# Patient Record
Sex: Male | Born: 1965 | Race: White | Hispanic: No | Marital: Married
Health system: Southern US, Community
[De-identification: ages and names within clinical notes are randomized; demographics above are authoritative.]

## PROBLEM LIST (undated history)

## (undated) DIAGNOSIS — I10 Essential (primary) hypertension: Secondary | ICD-10-CM

---

## 2019-05-20 DIAGNOSIS — R739 Hyperglycemia, unspecified: Secondary | ICD-10-CM | POA: Diagnosis not present

## 2019-05-20 DIAGNOSIS — R200 Anesthesia of skin: Secondary | ICD-10-CM | POA: Diagnosis not present

## 2019-05-20 DIAGNOSIS — I10 Essential (primary) hypertension: Secondary | ICD-10-CM | POA: Diagnosis not present

## 2019-05-20 DIAGNOSIS — E782 Mixed hyperlipidemia: Secondary | ICD-10-CM | POA: Diagnosis not present

## 2019-05-20 DIAGNOSIS — E0590 Thyrotoxicosis, unspecified without thyrotoxic crisis or storm: Secondary | ICD-10-CM | POA: Diagnosis not present

## 2019-05-20 DIAGNOSIS — R69 Illness, unspecified: Secondary | ICD-10-CM | POA: Diagnosis not present

## 2019-05-20 DIAGNOSIS — M1990 Unspecified osteoarthritis, unspecified site: Secondary | ICD-10-CM | POA: Diagnosis not present

## 2019-05-20 DIAGNOSIS — Z6831 Body mass index (BMI) 31.0-31.9, adult: Secondary | ICD-10-CM | POA: Diagnosis not present

## 2019-07-21 DIAGNOSIS — R002 Palpitations: Secondary | ICD-10-CM | POA: Diagnosis not present

## 2019-07-21 DIAGNOSIS — R69 Illness, unspecified: Secondary | ICD-10-CM | POA: Diagnosis not present

## 2019-07-21 DIAGNOSIS — R000 Tachycardia, unspecified: Secondary | ICD-10-CM | POA: Diagnosis not present

## 2019-07-22 DIAGNOSIS — R000 Tachycardia, unspecified: Secondary | ICD-10-CM | POA: Diagnosis not present

## 2019-08-20 DIAGNOSIS — Z125 Encounter for screening for malignant neoplasm of prostate: Secondary | ICD-10-CM | POA: Diagnosis not present

## 2019-08-20 DIAGNOSIS — R69 Illness, unspecified: Secondary | ICD-10-CM | POA: Diagnosis not present

## 2019-08-20 DIAGNOSIS — E782 Mixed hyperlipidemia: Secondary | ICD-10-CM | POA: Diagnosis not present

## 2019-08-20 DIAGNOSIS — R739 Hyperglycemia, unspecified: Secondary | ICD-10-CM | POA: Diagnosis not present

## 2019-08-20 DIAGNOSIS — Z0000 Encounter for general adult medical examination without abnormal findings: Secondary | ICD-10-CM | POA: Diagnosis not present

## 2019-08-20 DIAGNOSIS — Z6832 Body mass index (BMI) 32.0-32.9, adult: Secondary | ICD-10-CM | POA: Diagnosis not present

## 2019-08-20 DIAGNOSIS — M1990 Unspecified osteoarthritis, unspecified site: Secondary | ICD-10-CM | POA: Diagnosis not present

## 2019-08-20 DIAGNOSIS — I10 Essential (primary) hypertension: Secondary | ICD-10-CM | POA: Diagnosis not present

## 2019-11-26 DIAGNOSIS — E0590 Thyrotoxicosis, unspecified without thyrotoxic crisis or storm: Secondary | ICD-10-CM | POA: Diagnosis not present

## 2019-11-26 DIAGNOSIS — Z6833 Body mass index (BMI) 33.0-33.9, adult: Secondary | ICD-10-CM | POA: Diagnosis not present

## 2019-11-26 DIAGNOSIS — R69 Illness, unspecified: Secondary | ICD-10-CM | POA: Diagnosis not present

## 2019-11-26 DIAGNOSIS — E782 Mixed hyperlipidemia: Secondary | ICD-10-CM | POA: Diagnosis not present

## 2019-11-26 DIAGNOSIS — R739 Hyperglycemia, unspecified: Secondary | ICD-10-CM | POA: Diagnosis not present

## 2019-11-26 DIAGNOSIS — I10 Essential (primary) hypertension: Secondary | ICD-10-CM | POA: Diagnosis not present

## 2019-11-26 DIAGNOSIS — M1990 Unspecified osteoarthritis, unspecified site: Secondary | ICD-10-CM | POA: Diagnosis not present

## 2020-02-27 DIAGNOSIS — R739 Hyperglycemia, unspecified: Secondary | ICD-10-CM | POA: Diagnosis not present

## 2020-02-27 DIAGNOSIS — E0590 Thyrotoxicosis, unspecified without thyrotoxic crisis or storm: Secondary | ICD-10-CM | POA: Diagnosis not present

## 2020-02-27 DIAGNOSIS — E782 Mixed hyperlipidemia: Secondary | ICD-10-CM | POA: Diagnosis not present

## 2020-02-27 DIAGNOSIS — R748 Abnormal levels of other serum enzymes: Secondary | ICD-10-CM | POA: Diagnosis not present

## 2020-07-27 DIAGNOSIS — M47817 Spondylosis without myelopathy or radiculopathy, lumbosacral region: Secondary | ICD-10-CM | POA: Diagnosis not present

## 2020-07-27 DIAGNOSIS — S32001A Stable burst fracture of unspecified lumbar vertebra, initial encounter for closed fracture: Secondary | ICD-10-CM | POA: Diagnosis not present

## 2020-07-27 DIAGNOSIS — Z20822 Contact with and (suspected) exposure to covid-19: Secondary | ICD-10-CM | POA: Diagnosis not present

## 2020-07-27 DIAGNOSIS — S32012A Unstable burst fracture of first lumbar vertebra, initial encounter for closed fracture: Secondary | ICD-10-CM | POA: Diagnosis not present

## 2020-07-27 DIAGNOSIS — M545 Low back pain: Secondary | ICD-10-CM | POA: Diagnosis not present

## 2020-07-27 DIAGNOSIS — R222 Localized swelling, mass and lump, trunk: Secondary | ICD-10-CM | POA: Diagnosis not present

## 2020-07-27 DIAGNOSIS — M5127 Other intervertebral disc displacement, lumbosacral region: Secondary | ICD-10-CM | POA: Diagnosis not present

## 2020-07-27 DIAGNOSIS — R0902 Hypoxemia: Secondary | ICD-10-CM | POA: Diagnosis not present

## 2020-07-27 DIAGNOSIS — Y92410 Unspecified street and highway as the place of occurrence of the external cause: Secondary | ICD-10-CM | POA: Diagnosis not present

## 2020-07-27 DIAGNOSIS — Y999 Unspecified external cause status: Secondary | ICD-10-CM | POA: Diagnosis not present

## 2020-07-27 DIAGNOSIS — S32011A Stable burst fracture of first lumbar vertebra, initial encounter for closed fracture: Secondary | ICD-10-CM | POA: Diagnosis not present

## 2020-07-27 DIAGNOSIS — R52 Pain, unspecified: Secondary | ICD-10-CM | POA: Diagnosis not present

## 2020-07-28 DIAGNOSIS — Z7401 Bed confinement status: Secondary | ICD-10-CM | POA: Diagnosis not present

## 2020-07-28 DIAGNOSIS — Y999 Unspecified external cause status: Secondary | ICD-10-CM | POA: Diagnosis not present

## 2020-07-28 DIAGNOSIS — M545 Low back pain: Secondary | ICD-10-CM | POA: Diagnosis not present

## 2020-07-28 DIAGNOSIS — R0902 Hypoxemia: Secondary | ICD-10-CM | POA: Diagnosis not present

## 2020-07-28 DIAGNOSIS — Z20822 Contact with and (suspected) exposure to covid-19: Secondary | ICD-10-CM | POA: Diagnosis not present

## 2020-07-28 DIAGNOSIS — S32001A Stable burst fracture of unspecified lumbar vertebra, initial encounter for closed fracture: Secondary | ICD-10-CM | POA: Diagnosis not present

## 2020-07-28 DIAGNOSIS — Y92410 Unspecified street and highway as the place of occurrence of the external cause: Secondary | ICD-10-CM | POA: Diagnosis not present

## 2020-07-28 DIAGNOSIS — R52 Pain, unspecified: Secondary | ICD-10-CM | POA: Diagnosis not present

## 2020-07-28 DIAGNOSIS — M2550 Pain in unspecified joint: Secondary | ICD-10-CM | POA: Diagnosis not present

## 2020-07-30 ENCOUNTER — Emergency Department (HOSPITAL_COMMUNITY): Payer: No Typology Code available for payment source

## 2020-07-30 ENCOUNTER — Encounter (HOSPITAL_COMMUNITY): Payer: Self-pay | Admitting: *Deleted

## 2020-07-30 ENCOUNTER — Other Ambulatory Visit: Payer: Self-pay

## 2020-07-30 ENCOUNTER — Inpatient Hospital Stay (HOSPITAL_COMMUNITY)
Admission: EM | Admit: 2020-07-30 | Discharge: 2020-08-01 | DRG: 917 | Disposition: A | Discharge status: Home / Self Care | Payer: No Typology Code available for payment source | Attending: Internal Medicine | Admitting: Internal Medicine

## 2020-07-30 DIAGNOSIS — G92 Toxic encephalopathy: Secondary | ICD-10-CM | POA: Diagnosis not present

## 2020-07-30 DIAGNOSIS — E872 Acidosis, unspecified: Secondary | ICD-10-CM

## 2020-07-30 DIAGNOSIS — R4020 Unspecified coma: Secondary | ICD-10-CM | POA: Diagnosis not present

## 2020-07-30 DIAGNOSIS — J690 Pneumonitis due to inhalation of food and vomit: Secondary | ICD-10-CM

## 2020-07-30 DIAGNOSIS — I959 Hypotension, unspecified: Secondary | ICD-10-CM | POA: Diagnosis present

## 2020-07-30 DIAGNOSIS — Z79899 Other long term (current) drug therapy: Secondary | ICD-10-CM | POA: Diagnosis not present

## 2020-07-30 DIAGNOSIS — S32011D Stable burst fracture of first lumbar vertebra, subsequent encounter for fracture with routine healing: Secondary | ICD-10-CM

## 2020-07-30 DIAGNOSIS — Z8249 Family history of ischemic heart disease and other diseases of the circulatory system: Secondary | ICD-10-CM | POA: Diagnosis not present

## 2020-07-30 DIAGNOSIS — E785 Hyperlipidemia, unspecified: Secondary | ICD-10-CM | POA: Diagnosis present

## 2020-07-30 DIAGNOSIS — R0602 Shortness of breath: Secondary | ICD-10-CM

## 2020-07-30 DIAGNOSIS — N179 Acute kidney failure, unspecified: Secondary | ICD-10-CM | POA: Diagnosis present

## 2020-07-30 DIAGNOSIS — I10 Essential (primary) hypertension: Secondary | ICD-10-CM | POA: Diagnosis present

## 2020-07-30 DIAGNOSIS — F329 Major depressive disorder, single episode, unspecified: Secondary | ICD-10-CM | POA: Diagnosis present

## 2020-07-30 DIAGNOSIS — E875 Hyperkalemia: Secondary | ICD-10-CM | POA: Diagnosis present

## 2020-07-30 DIAGNOSIS — R0600 Dyspnea, unspecified: Secondary | ICD-10-CM | POA: Diagnosis not present

## 2020-07-30 DIAGNOSIS — F419 Anxiety disorder, unspecified: Secondary | ICD-10-CM | POA: Diagnosis present

## 2020-07-30 DIAGNOSIS — R69 Illness, unspecified: Secondary | ICD-10-CM | POA: Diagnosis not present

## 2020-07-30 DIAGNOSIS — J9602 Acute respiratory failure with hypercapnia: Secondary | ICD-10-CM | POA: Diagnosis present

## 2020-07-30 DIAGNOSIS — D72829 Elevated white blood cell count, unspecified: Secondary | ICD-10-CM | POA: Diagnosis present

## 2020-07-30 DIAGNOSIS — R0902 Hypoxemia: Secondary | ICD-10-CM | POA: Diagnosis not present

## 2020-07-30 DIAGNOSIS — E1165 Type 2 diabetes mellitus with hyperglycemia: Secondary | ICD-10-CM | POA: Diagnosis not present

## 2020-07-30 DIAGNOSIS — T402X1A Poisoning by other opioids, accidental (unintentional), initial encounter: Secondary | ICD-10-CM | POA: Diagnosis present

## 2020-07-30 DIAGNOSIS — G9340 Encephalopathy, unspecified: Secondary | ICD-10-CM

## 2020-07-30 DIAGNOSIS — Z20822 Contact with and (suspected) exposure to covid-19: Secondary | ICD-10-CM | POA: Diagnosis present

## 2020-07-30 DIAGNOSIS — J90 Pleural effusion, not elsewhere classified: Secondary | ICD-10-CM | POA: Diagnosis not present

## 2020-07-30 DIAGNOSIS — G4733 Obstructive sleep apnea (adult) (pediatric): Secondary | ICD-10-CM | POA: Diagnosis present

## 2020-07-30 DIAGNOSIS — Y92009 Unspecified place in unspecified non-institutional (private) residence as the place of occurrence of the external cause: Secondary | ICD-10-CM

## 2020-07-30 DIAGNOSIS — J9601 Acute respiratory failure with hypoxia: Secondary | ICD-10-CM | POA: Diagnosis not present

## 2020-07-30 DIAGNOSIS — R404 Transient alteration of awareness: Secondary | ICD-10-CM | POA: Diagnosis not present

## 2020-07-30 DIAGNOSIS — R0689 Other abnormalities of breathing: Secondary | ICD-10-CM | POA: Diagnosis not present

## 2020-07-30 HISTORY — DX: Essential (primary) hypertension: I10

## 2020-07-30 LAB — BASIC METABOLIC PANEL
Anion gap: 17 — ABNORMAL HIGH (ref 5–15)
BUN: 36 mg/dL — ABNORMAL HIGH (ref 6–20)
CO2: 20 mmol/L — ABNORMAL LOW (ref 22–32)
Calcium: 7.8 mg/dL — ABNORMAL LOW (ref 8.9–10.3)
Chloride: 104 mmol/L (ref 98–111)
Creatinine, Ser: 5.37 mg/dL — ABNORMAL HIGH (ref 0.61–1.24)
GFR calc Af Amer: 13 mL/min — ABNORMAL LOW (ref >60)
GFR calc non Af Amer: 11 mL/min — ABNORMAL LOW (ref >60)
Glucose, Bld: 171 mg/dL — ABNORMAL HIGH (ref 70–99)
Potassium: 5.2 mmol/L — ABNORMAL HIGH (ref 3.5–5.1)
Sodium: 141 mmol/L (ref 135–145)

## 2020-07-30 LAB — I-STAT ARTERIAL BLOOD GAS, ED
Acid-base deficit: 2 mmol/L (ref 0.0–2.0)
Bicarbonate: 27.2 mmol/L (ref 20.0–28.0)
Calcium, Ion: 1.14 mmol/L — ABNORMAL LOW (ref 1.15–1.40)
HCT: 38 % — ABNORMAL LOW (ref 39.0–52.0)
Hemoglobin: 12.9 g/dL — ABNORMAL LOW (ref 13.0–17.0)
O2 Saturation: 82 %
Potassium: 4.5 mmol/L (ref 3.5–5.1)
Sodium: 141 mmol/L (ref 135–145)
TCO2: 29 mmol/L (ref 22–32)
pCO2 arterial: 69.1 mmHg (ref 32.0–48.0)
pH, Arterial: 7.203 — ABNORMAL LOW (ref 7.350–7.450)
pO2, Arterial: 58 mmHg — ABNORMAL LOW (ref 83.0–108.0)

## 2020-07-30 LAB — COMPREHENSIVE METABOLIC PANEL
ALT: 49 U/L — ABNORMAL HIGH (ref 0–44)
AST: 44 U/L — ABNORMAL HIGH (ref 15–41)
Albumin: 3.5 g/dL (ref 3.5–5.0)
Alkaline Phosphatase: 51 U/L (ref 38–126)
Anion gap: 12 (ref 5–15)
BUN: 36 mg/dL — ABNORMAL HIGH (ref 6–20)
CO2: 24 mmol/L (ref 22–32)
Calcium: 8 mg/dL — ABNORMAL LOW (ref 8.9–10.3)
Chloride: 105 mmol/L (ref 98–111)
Creatinine, Ser: 4.75 mg/dL — ABNORMAL HIGH (ref 0.61–1.24)
GFR calc Af Amer: 15 mL/min — ABNORMAL LOW (ref >60)
GFR calc non Af Amer: 13 mL/min — ABNORMAL LOW (ref >60)
Glucose, Bld: 102 mg/dL — ABNORMAL HIGH (ref 70–99)
Potassium: 4.4 mmol/L (ref 3.5–5.1)
Sodium: 141 mmol/L (ref 135–145)
Total Bilirubin: 1.4 mg/dL — ABNORMAL HIGH (ref 0.3–1.2)
Total Protein: 6.2 g/dL — ABNORMAL LOW (ref 6.5–8.1)

## 2020-07-30 LAB — CBC
HCT: 40.6 % (ref 39.0–52.0)
Hemoglobin: 13.1 g/dL (ref 13.0–17.0)
MCH: 30.5 pg (ref 26.0–34.0)
MCHC: 32.3 g/dL (ref 30.0–36.0)
MCV: 94.6 fL (ref 80.0–100.0)
Platelets: 215 10*3/uL (ref 150–400)
RBC: 4.29 MIL/uL (ref 4.22–5.81)
RDW: 13 % (ref 11.5–15.5)
WBC: 12.3 10*3/uL — ABNORMAL HIGH (ref 4.0–10.5)
nRBC: 0 % (ref 0.0–0.2)

## 2020-07-30 LAB — BLOOD GAS, ARTERIAL
Acid-base deficit: 1.5 mmol/L (ref 0.0–2.0)
Bicarbonate: 25.1 mmol/L (ref 20.0–28.0)
Drawn by: 59156
FIO2: 40
O2 Saturation: 95.5 %
Patient temperature: 37.2
pCO2 arterial: 63.5 mmHg — ABNORMAL HIGH (ref 32.0–48.0)
pH, Arterial: 7.223 — ABNORMAL LOW (ref 7.350–7.450)
pO2, Arterial: 85.2 mmHg (ref 83.0–108.0)

## 2020-07-30 LAB — CBG MONITORING, ED: Glucose-Capillary: 164 mg/dL — ABNORMAL HIGH (ref 70–99)

## 2020-07-30 LAB — LACTIC ACID, PLASMA
Lactic Acid, Venous: 3.3 mmol/L (ref 0.5–1.9)
Lactic Acid, Venous: 1.5 mmol/L (ref 0.5–1.9)

## 2020-07-30 LAB — SARS CORONAVIRUS 2 BY RT PCR (HOSPITAL ORDER, PERFORMED IN ~~LOC~~ HOSPITAL LAB): SARS Coronavirus 2: NEGATIVE

## 2020-07-30 LAB — HIV ANTIBODY (ROUTINE TESTING W REFLEX): HIV Screen 4th Generation wRfx: NONREACTIVE

## 2020-07-30 LAB — PROCALCITONIN: Procalcitonin: 0.41 ng/mL

## 2020-07-30 LAB — CK: Total CK: 401 U/L — ABNORMAL HIGH (ref 49–397)

## 2020-07-30 LAB — ETHANOL: Alcohol, Ethyl (B): <10 mg/dL (ref <10)

## 2020-07-30 MED ORDER — LACTATED RINGERS IV BOLUS
1000.0000 mL | Freq: Once | INTRAVENOUS | Status: AC
  Administered 2020-07-30: 1000 mL via INTRAVENOUS

## 2020-07-30 MED ORDER — NALOXONE HCL 0.4 MG/ML IJ SOLN: 0.4000 mg | INTRAMUSCULAR | Status: DC | PRN

## 2020-07-30 MED ORDER — HEPARIN SODIUM (PORCINE) 5000 UNIT/ML IJ SOLN
5000.0000 [IU] | Freq: Three times a day (TID) | INTRAMUSCULAR | Status: DC
  Administered 2020-07-30 – 2020-08-01 (×6): 5000 [IU] via SUBCUTANEOUS
  Filled 2020-07-30 (×6): qty 1

## 2020-07-30 MED ORDER — LIDOCAINE 5 % EX PTCH
1.0000 | MEDICATED_PATCH | CUTANEOUS | Status: DC
  Administered 2020-07-30 – 2020-07-31 (×2): 1 via TRANSDERMAL
  Filled 2020-07-30 (×3): qty 1

## 2020-07-30 MED ORDER — SODIUM CHLORIDE 0.9 % IV SOLN: INTRAVENOUS | Status: DC

## 2020-07-30 MED ORDER — LACTATED RINGERS IV SOLN: INTRAVENOUS | Status: DC

## 2020-07-30 MED ORDER — POLYETHYLENE GLYCOL 3350 17 G PO PACK: 17.0000 g | PACK | Freq: Every day | ORAL | Status: DC | PRN

## 2020-07-30 MED ORDER — NALOXONE HCL 2 MG/2ML IJ SOSY
1.0000 mg | PREFILLED_SYRINGE | Freq: Once | INTRAMUSCULAR | Status: AC
  Administered 2020-07-30: 1 mg via INTRAVENOUS
  Filled 2020-07-30: qty 2

## 2020-07-30 MED ORDER — LACTATED RINGERS IV BOLUS
500.0000 mL | Freq: Once | INTRAVENOUS | Status: AC
  Administered 2020-07-30: 500 mL via INTRAVENOUS

## 2020-07-30 MED ORDER — PIPERACILLIN-TAZOBACTAM 3.375 G IVPB 30 MIN
3.3750 g | Freq: Once | INTRAVENOUS | Status: AC
  Administered 2020-07-30: 3.375 g via INTRAVENOUS
  Filled 2020-07-30: qty 50

## 2020-07-30 MED ORDER — SODIUM ZIRCONIUM CYCLOSILICATE 10 G PO PACK
10.0000 g | PACK | Freq: Once | ORAL | Status: AC
  Administered 2020-07-30: 10 g via ORAL
  Filled 2020-07-30: qty 1

## 2020-07-30 NOTE — Progress Notes (Signed)
RT attempted ABG X 2 without success.  Patient being transported to 2W.  Patient alert and oriented on BiPAP.  Sat's 94% on 40% FiO2.  RT will continue to monitor.
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
## 2020-07-30 NOTE — ED Provider Notes (Signed)
I saw and evaluated the patient, reviewed the resident's note and I agree with the findings and plan.  EKG: EKG Interpretation  Date/Time:  Thursday July 30 2020 10:11:25 EDT Ventricular Rate:  111 PR Interval:    QRS Duration: 105 QT Interval:  345 QTC Calculation: 469 R Axis:   86 Text Interpretation: Sinus tachycardia RSR' in V1 or V2, right VCD or RVH Borderline T abnormalities, inferior leads No old tracing to compare Confirmed by Lorre Nick ([id number]) on 07/30/2020 10:68:60 AM   54 year old male presents here after intentional overdose which responded to Narcan.  Patient is on morphine for back pain.  Is now awake and alert but he is short of breath and coughing.  Chest x-ray concerning for aspiration pneumonia.  Given that he has new oxygen requirement will require admission for aspiration pneumonia   Lorre Nick, MD 07/30/20 1044
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
## 2020-07-31 ENCOUNTER — Inpatient Hospital Stay (HOSPITAL_COMMUNITY): Payer: No Typology Code available for payment source

## 2020-07-31 DIAGNOSIS — J9601 Acute respiratory failure with hypoxia: Secondary | ICD-10-CM

## 2020-07-31 DIAGNOSIS — J9602 Acute respiratory failure with hypercapnia: Secondary | ICD-10-CM

## 2020-07-31 DIAGNOSIS — F329 Major depressive disorder, single episode, unspecified: Secondary | ICD-10-CM

## 2020-07-31 DIAGNOSIS — G92 Toxic encephalopathy: Secondary | ICD-10-CM

## 2020-07-31 DIAGNOSIS — F419 Anxiety disorder, unspecified: Secondary | ICD-10-CM

## 2020-07-31 DIAGNOSIS — N179 Acute kidney failure, unspecified: Secondary | ICD-10-CM

## 2020-07-31 DIAGNOSIS — T402X1A Poisoning by other opioids, accidental (unintentional), initial encounter: Principal | ICD-10-CM

## 2020-07-31 LAB — COMPREHENSIVE METABOLIC PANEL
ALT: 45 U/L — ABNORMAL HIGH (ref 0–44)
AST: 37 U/L (ref 15–41)
Albumin: 3.6 g/dL (ref 3.5–5.0)
Alkaline Phosphatase: 47 U/L (ref 38–126)
Anion gap: 11 (ref 5–15)
BUN: 36 mg/dL — ABNORMAL HIGH (ref 6–20)
CO2: 26 mmol/L (ref 22–32)
Calcium: 8.5 mg/dL — ABNORMAL LOW (ref 8.9–10.3)
Chloride: 104 mmol/L (ref 98–111)
Creatinine, Ser: 2.15 mg/dL — ABNORMAL HIGH (ref 0.61–1.24)
GFR calc Af Amer: 39 mL/min — ABNORMAL LOW (ref >60)
GFR calc non Af Amer: 33 mL/min — ABNORMAL LOW (ref >60)
Glucose, Bld: 109 mg/dL — ABNORMAL HIGH (ref 70–99)
Potassium: 4.2 mmol/L (ref 3.5–5.1)
Sodium: 141 mmol/L (ref 135–145)
Total Bilirubin: 1.4 mg/dL — ABNORMAL HIGH (ref 0.3–1.2)
Total Protein: 6.4 g/dL — ABNORMAL LOW (ref 6.5–8.1)

## 2020-07-31 LAB — BASIC METABOLIC PANEL
Anion gap: 8 (ref 5–15)
BUN: 27 mg/dL — ABNORMAL HIGH (ref 6–20)
CO2: 28 mmol/L (ref 22–32)
Calcium: 8.6 mg/dL — ABNORMAL LOW (ref 8.9–10.3)
Chloride: 102 mmol/L (ref 98–111)
Creatinine, Ser: 1.27 mg/dL — ABNORMAL HIGH (ref 0.61–1.24)
GFR calc Af Amer: >60 mL/min (ref >60)
GFR calc non Af Amer: >60 mL/min (ref >60)
Glucose, Bld: 131 mg/dL — ABNORMAL HIGH (ref 70–99)
Potassium: 4 mmol/L (ref 3.5–5.1)
Sodium: 138 mmol/L (ref 135–145)

## 2020-07-31 LAB — BLOOD GAS, VENOUS
Acid-base deficit: 0 mmol/L (ref 0.0–2.0)
Bicarbonate: 26.1 mmol/L (ref 20.0–28.0)
Drawn by: 8120
FIO2: 40
O2 Saturation: 98.5 %
Patient temperature: 37.3
pCO2, Ven: 59.6 mmHg (ref 44.0–60.0)
pH, Ven: 7.266 (ref 7.250–7.430)
pO2, Ven: 130 mmHg — ABNORMAL HIGH (ref 32.0–45.0)

## 2020-07-31 LAB — BLOOD GAS, ARTERIAL
Acid-Base Excess: 5.3 mmol/L — ABNORMAL HIGH (ref 0.0–2.0)
Acid-base deficit: 0.3 mmol/L (ref 0.0–2.0)
Bicarbonate: 25.7 mmol/L (ref 20.0–28.0)
Bicarbonate: 30.9 mmol/L — ABNORMAL HIGH (ref 20.0–28.0)
FIO2: 40
FIO2: 40
O2 Saturation: 98.9 %
O2 Saturation: 95.3 %
Patient temperature: 37.4
Patient temperature: 37.6
pCO2 arterial: 58.7 mmHg — ABNORMAL HIGH (ref 32.0–48.0)
pCO2 arterial: 62.1 mmHg — ABNORMAL HIGH (ref 32.0–48.0)
pH, Arterial: 7.267 — ABNORMAL LOW (ref 7.350–7.450)
pH, Arterial: 7.322 — ABNORMAL LOW (ref 7.350–7.450)
pO2, Arterial: 142 mmHg — ABNORMAL HIGH (ref 83.0–108.0)
pO2, Arterial: 76.2 mmHg — ABNORMAL LOW (ref 83.0–108.0)

## 2020-07-31 LAB — CBC
HCT: 39.3 % (ref 39.0–52.0)
Hemoglobin: 13.3 g/dL (ref 13.0–17.0)
MCH: 30.9 pg (ref 26.0–34.0)
MCHC: 33.8 g/dL (ref 30.0–36.0)
MCV: 91.2 fL (ref 80.0–100.0)
Platelets: 196 10*3/uL (ref 150–400)
RBC: 4.31 MIL/uL (ref 4.22–5.81)
RDW: 12.7 % (ref 11.5–15.5)
WBC: 10.7 10*3/uL — ABNORMAL HIGH (ref 4.0–10.5)
nRBC: 0 % (ref 0.0–0.2)

## 2020-07-31 LAB — CK: Total CK: 491 U/L — ABNORMAL HIGH (ref 49–397)

## 2020-07-31 MED ORDER — PAROXETINE HCL 20 MG PO TABS
40.0000 mg | ORAL_TABLET | Freq: Every day | ORAL | Status: DC
  Administered 2020-07-31 – 2020-08-01 (×2): 40 mg via ORAL
  Filled 2020-07-31 (×2): qty 2

## 2020-07-31 NOTE — Progress Notes (Signed)
RT NOTES: Removed patient from bipap and placed on 5lpm nasal cannula. Sats 93%. Will continue to monitor.

## 2020-07-31 NOTE — Evaluation (Signed)
SLP Cancellation Note  Patient Details Name: Tom Sutton MRN: [id number] DOB: Nov 06, 1966   Cancelled treatment:       Reason Eval/Treat Not Completed: Other (comment);Medical issues which prohibited therapy (pt on bipap, rn advises she doesn't know when swallow eval will be indicated, advised her to reach out to slp when ready, thanks)   Chales Abrahams 07/31/2020, 7:58 AM   Rolena Infante, MS [facility] SLP Acute Rehab Services Office [phone]

## 2020-07-31 NOTE — Progress Notes (Signed)
SATURATION QUALIFICATIONS: (This note is used to comply with regulatory documentation for home oxygen)  Patient Saturations on Room Air at Rest =85%  Patient Saturations on Room Air while Ambulating = 78%  Patient Saturations on 2 Liters of oxygen while Ambulating = 85%  Patient Saturations on 6 Liters of oxygen while Ambulating = Beginning 97%           End of walk 90%  Please briefly explain why patient needs home oxygen: pt's o2 drops with any exertion.
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
## 2020-07-31 NOTE — Progress Notes (Signed)
RT NOTES: ABG obtained and sent to lab. Lab tech notified.  
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
## 2020-08-01 LAB — BASIC METABOLIC PANEL
Anion gap: 12 (ref 5–15)
BUN: 17 mg/dL (ref 6–20)
CO2: 28 mmol/L (ref 22–32)
Calcium: 9.2 mg/dL (ref 8.9–10.3)
Chloride: 101 mmol/L (ref 98–111)
Creatinine, Ser: 0.72 mg/dL (ref 0.61–1.24)
GFR calc Af Amer: >60 mL/min (ref >60)
GFR calc non Af Amer: >60 mL/min (ref >60)
Glucose, Bld: 116 mg/dL — ABNORMAL HIGH (ref 70–99)
Potassium: 4 mmol/L (ref 3.5–5.1)
Sodium: 141 mmol/L (ref 135–145)

## 2020-08-01 LAB — CBC WITH DIFFERENTIAL/PLATELET
Abs Immature Granulocytes: 0.02 10*3/uL (ref 0.00–0.07)
Basophils Absolute: 0 10*3/uL (ref 0.0–0.1)
Basophils Relative: 0 %
Eosinophils Absolute: 0 10*3/uL (ref 0.0–0.5)
Eosinophils Relative: 1 %
HCT: 37.4 % — ABNORMAL LOW (ref 39.0–52.0)
Hemoglobin: 12.7 g/dL — ABNORMAL LOW (ref 13.0–17.0)
Immature Granulocytes: 0 %
Lymphocytes Relative: 14 %
Lymphs Abs: 1 10*3/uL (ref 0.7–4.0)
MCH: 29.8 pg (ref 26.0–34.0)
MCHC: 34 g/dL (ref 30.0–36.0)
MCV: 87.8 fL (ref 80.0–100.0)
Monocytes Absolute: 0.8 10*3/uL (ref 0.1–1.0)
Monocytes Relative: 11 %
Neutro Abs: 5.4 10*3/uL (ref 1.7–7.7)
Neutrophils Relative %: 74 %
Platelets: 202 10*3/uL (ref 150–400)
RBC: 4.26 MIL/uL (ref 4.22–5.81)
RDW: 12 % (ref 11.5–15.5)
WBC: 7.4 10*3/uL (ref 4.0–10.5)
nRBC: 0 % (ref 0.0–0.2)

## 2020-08-01 LAB — PROCALCITONIN: Procalcitonin: 0.96 ng/mL

## 2020-08-01 MED ORDER — OXYCODONE HCL 5 MG PO TABS: 2.5000 mg | ORAL_TABLET | Freq: Four times a day (QID) | ORAL | 0 refills | Status: AC | PRN

## 2020-08-01 MED ORDER — ACETAMINOPHEN 500 MG PO TABS: 1000.0000 mg | ORAL_TABLET | Freq: Three times a day (TID) | ORAL | 0 refills | Status: AC

## 2020-08-01 MED ORDER — LIDOCAINE 5 % EX PTCH: 1.0000 | MEDICATED_PATCH | CUTANEOUS | 0 refills | Status: AC
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
## 2020-08-01 NOTE — Progress Notes (Signed)
Patient expressing desire to go home.  Tolerated ambulation on R/A well.  MD will be notified.

## 2020-08-01 NOTE — Progress Notes (Signed)
SATURATION QUALIFICATIONS: (This note is used to comply with regulatory documentation for home oxygen)  Patient Saturations on Room Air at Rest = 91% Patient Saturations on Room Air while Ambulating = 97%  Please briefly explain why patient needs home oxygen: Results not indicative of need for home oxygen.

## 2020-08-01 NOTE — Progress Notes (Signed)
   Subjective:   No acute events overnight. Mr. Dery was seen and evaluated at bedside on morning rounds. As soon as we walked in, he expressed the desire to go home. He denies any difficulty breathing, cough, fever, chills, pleuritic CP, nausea or vomiting.   His PCP is at [facility]. Will make an appointment to see her next week. Discussed that we will be changing his pain medications at discharge, and if he maintains good O2 sats with ambulation he will be able to go home today.   Objective:  Vital signs in last 24 hours: Vitals:   08/01/20 0349 08/01/20 0722 08/01/20 0830 08/01/20 0847  BP: (!) 149/96 (!) 172/120    Pulse: 85 66  (!) 102  Resp: 18 19    Temp: 98.5 F (36.9 C) 98.7 F (37.1 C)    TempSrc: Oral     SpO2: 92% 97% 91% 97%   Constitutional: awake, alert, sitting up in bed in NAD Cardiovascular: RRR; no m/r/g Pulmonary: Normal work of breathing, saturating well on room air; CTA throughout   Assessment/Plan:  Principal Problem:   Acute respiratory failure with hypoxia and hypercarbia (HCC) Active Problems:   Acute kidney injury (HCC)   Hyperkalemia   Acute encephalopathy   Metabolic acidosis   Mr. Leisinger is a 54 year old gentleman with medical history significant for hypertension, hyperlipidemia, depression, anxiety who presented to the emergency department with altered mental status due to oversedation from recently prescribed pain medication to treat L1 fracture.This is hospital day 2.   Acute toxic encephalopathy 2/2 oversedation from prescribed opioids Acute hypoxic, hypercarbic respiratory failure 2/2 narcotic use  Possible underlying OHS/OSA Patient's respiratory failure has completely resolved. He is saturating well on room air at rest and with ambulation, indicating this was all related to oversedation from prescribed narcotics.  Will adjust pain medications accordingly. Given his history of snoring, we are recommending outpatient sleep  study to evaluate for possible underlying OSA.    Acute renal failure with hyperkalemia, resolved Baseline 0.68. sCr 5.37 on admission. Likely pre-renal secondary to prolonged downtime on day of admission. Improved back to baseline with IVF.    L1 burst fracture Pain well-controlled. - Lidocaine 5% patch - On discharge, will prescribe Tylenol 1 g TID for 2 weeks as well as oxy 5 mg (1/2 to 1 pill) q6h PRN for severe pain - has follow-up with neurosurgery    Hx Hypertension -continue home Lisinopril at discharge   Anxiety/Depression - Home paxil 40 mg daily     Lenward Chancellor D, DO PGY-2 08/01/20, 5:33 PM After 5pm on weekdays and 1pm on weekends: On Call pager [phone]

## 2020-08-04 LAB — CULTURE, BLOOD (ROUTINE X 2)
Culture: NO GROWTH
Culture: NO GROWTH
Special Requests: ADEQUATE
Special Requests: ADEQUATE

## 2020-08-06 ENCOUNTER — Telehealth: Payer: Self-pay | Admitting: *Deleted

## 2020-08-06 NOTE — Telephone Encounter (Signed)
Information was sent through CoverMyMeds for PA for Lidocaine 5% Patches.  PA was denied as is not being used for a covered reason. Such as post-herpatic neuralgia, Diabetic Neuropathy or Cancer related Neuropathy.  Message to be sent to notify doctor of decision.  Angelina Ok, RN 08/06/2020 11:27 AM.

## 2020-08-07 ENCOUNTER — Other Ambulatory Visit: Payer: Self-pay | Admitting: Student

## 2020-08-07 DIAGNOSIS — S32001D Stable burst fracture of unspecified lumbar vertebra, subsequent encounter for fracture with routine healing: Secondary | ICD-10-CM | POA: Insufficient documentation

## 2020-08-07 MED ORDER — DICLOFENAC SODIUM 1 % EX GEL: 4.0000 g | Freq: Four times a day (QID) | CUTANEOUS | 1 refills | Status: AC | PRN
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
## 2020-08-31 DIAGNOSIS — Z125 Encounter for screening for malignant neoplasm of prostate: Secondary | ICD-10-CM | POA: Diagnosis not present

## 2020-08-31 DIAGNOSIS — Z79899 Other long term (current) drug therapy: Secondary | ICD-10-CM | POA: Diagnosis not present

## 2020-08-31 DIAGNOSIS — E782 Mixed hyperlipidemia: Secondary | ICD-10-CM | POA: Diagnosis not present

## 2020-08-31 DIAGNOSIS — Z6832 Body mass index (BMI) 32.0-32.9, adult: Secondary | ICD-10-CM | POA: Diagnosis not present

## 2020-08-31 DIAGNOSIS — I10 Essential (primary) hypertension: Secondary | ICD-10-CM | POA: Diagnosis not present

## 2020-08-31 DIAGNOSIS — R69 Illness, unspecified: Secondary | ICD-10-CM | POA: Diagnosis not present

## 2020-08-31 DIAGNOSIS — E0590 Thyrotoxicosis, unspecified without thyrotoxic crisis or storm: Secondary | ICD-10-CM | POA: Diagnosis not present

## 2020-08-31 DIAGNOSIS — M1990 Unspecified osteoarthritis, unspecified site: Secondary | ICD-10-CM | POA: Diagnosis not present

## 2020-08-31 DIAGNOSIS — R739 Hyperglycemia, unspecified: Secondary | ICD-10-CM | POA: Diagnosis not present

## 2020-09-11 ENCOUNTER — Ambulatory Visit: Payer: Self-pay | Admitting: Medical

## 2020-09-12 IMAGING — DX DG CHEST 2V
2 series · 2 of 2 positions shown · non-contrast
Comparison: July 30, 2020

CLINICAL DATA: Dyspnea

EXAM:
CHEST - 2 VIEW

[chest ap]
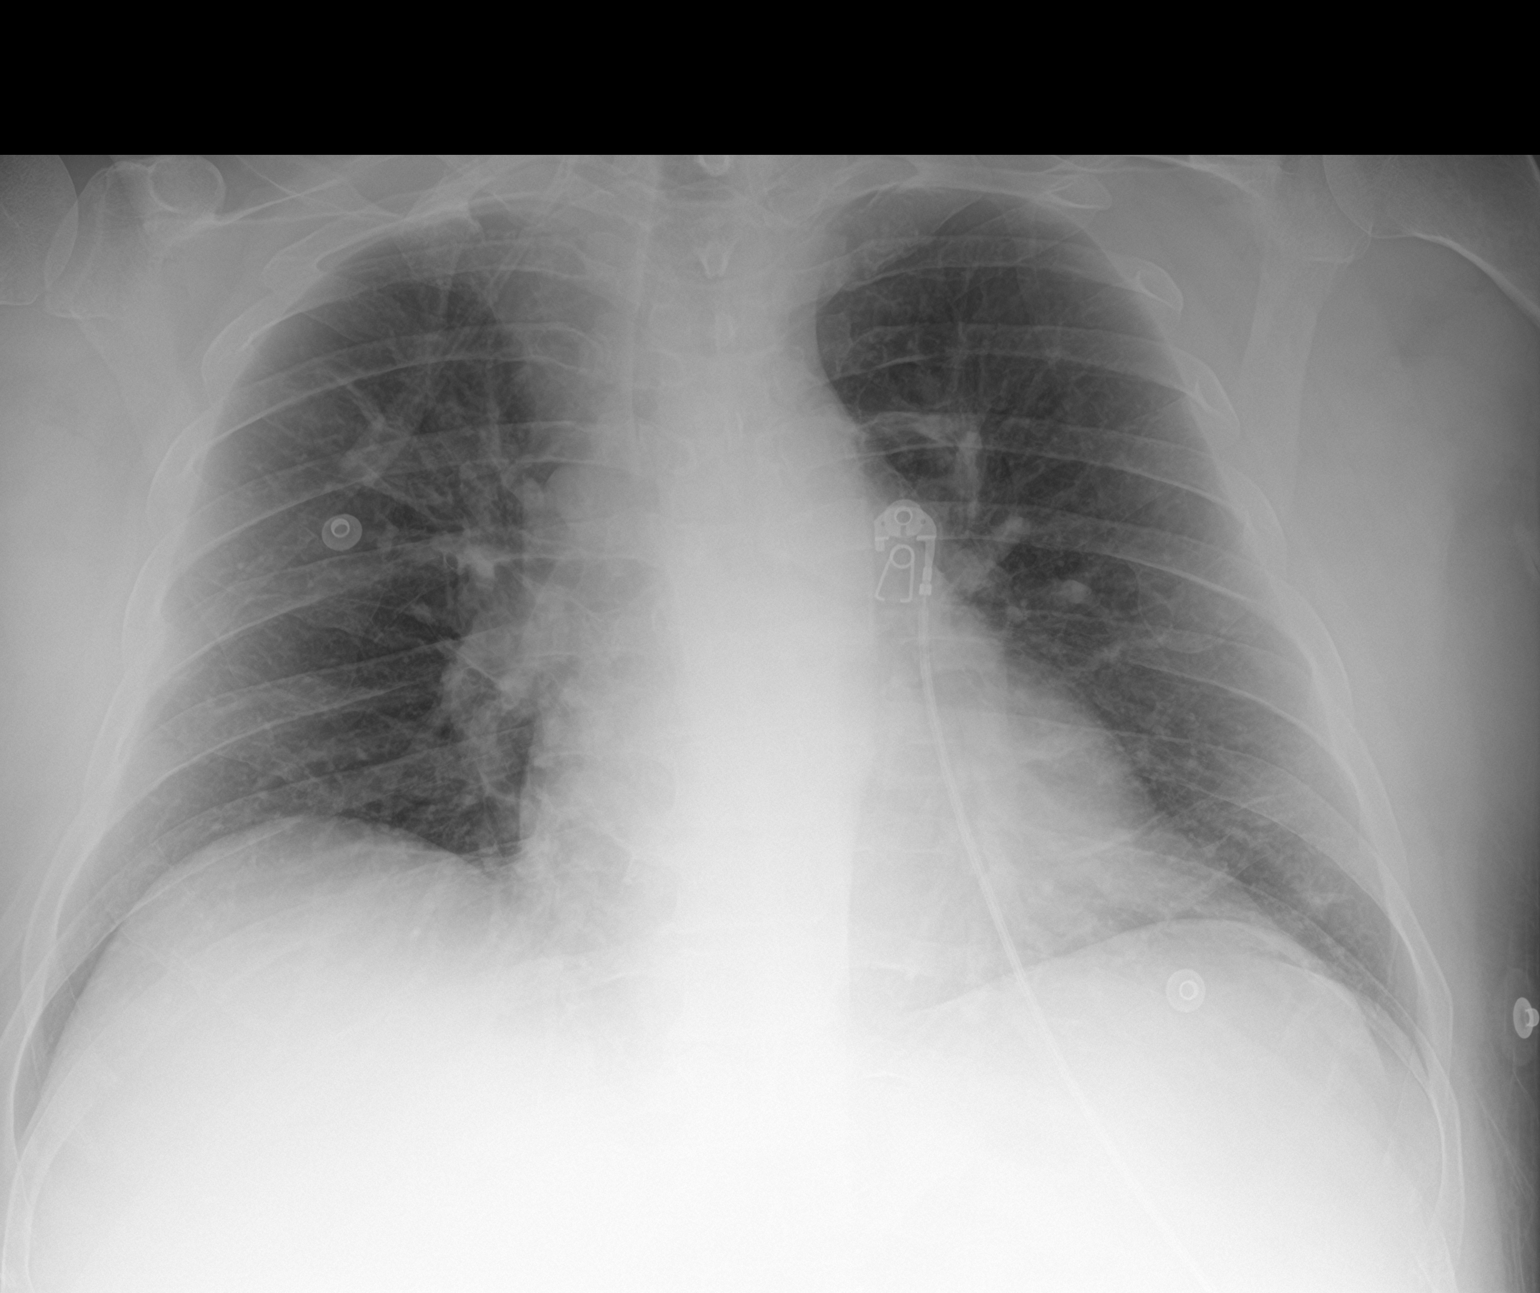

[chest lat]
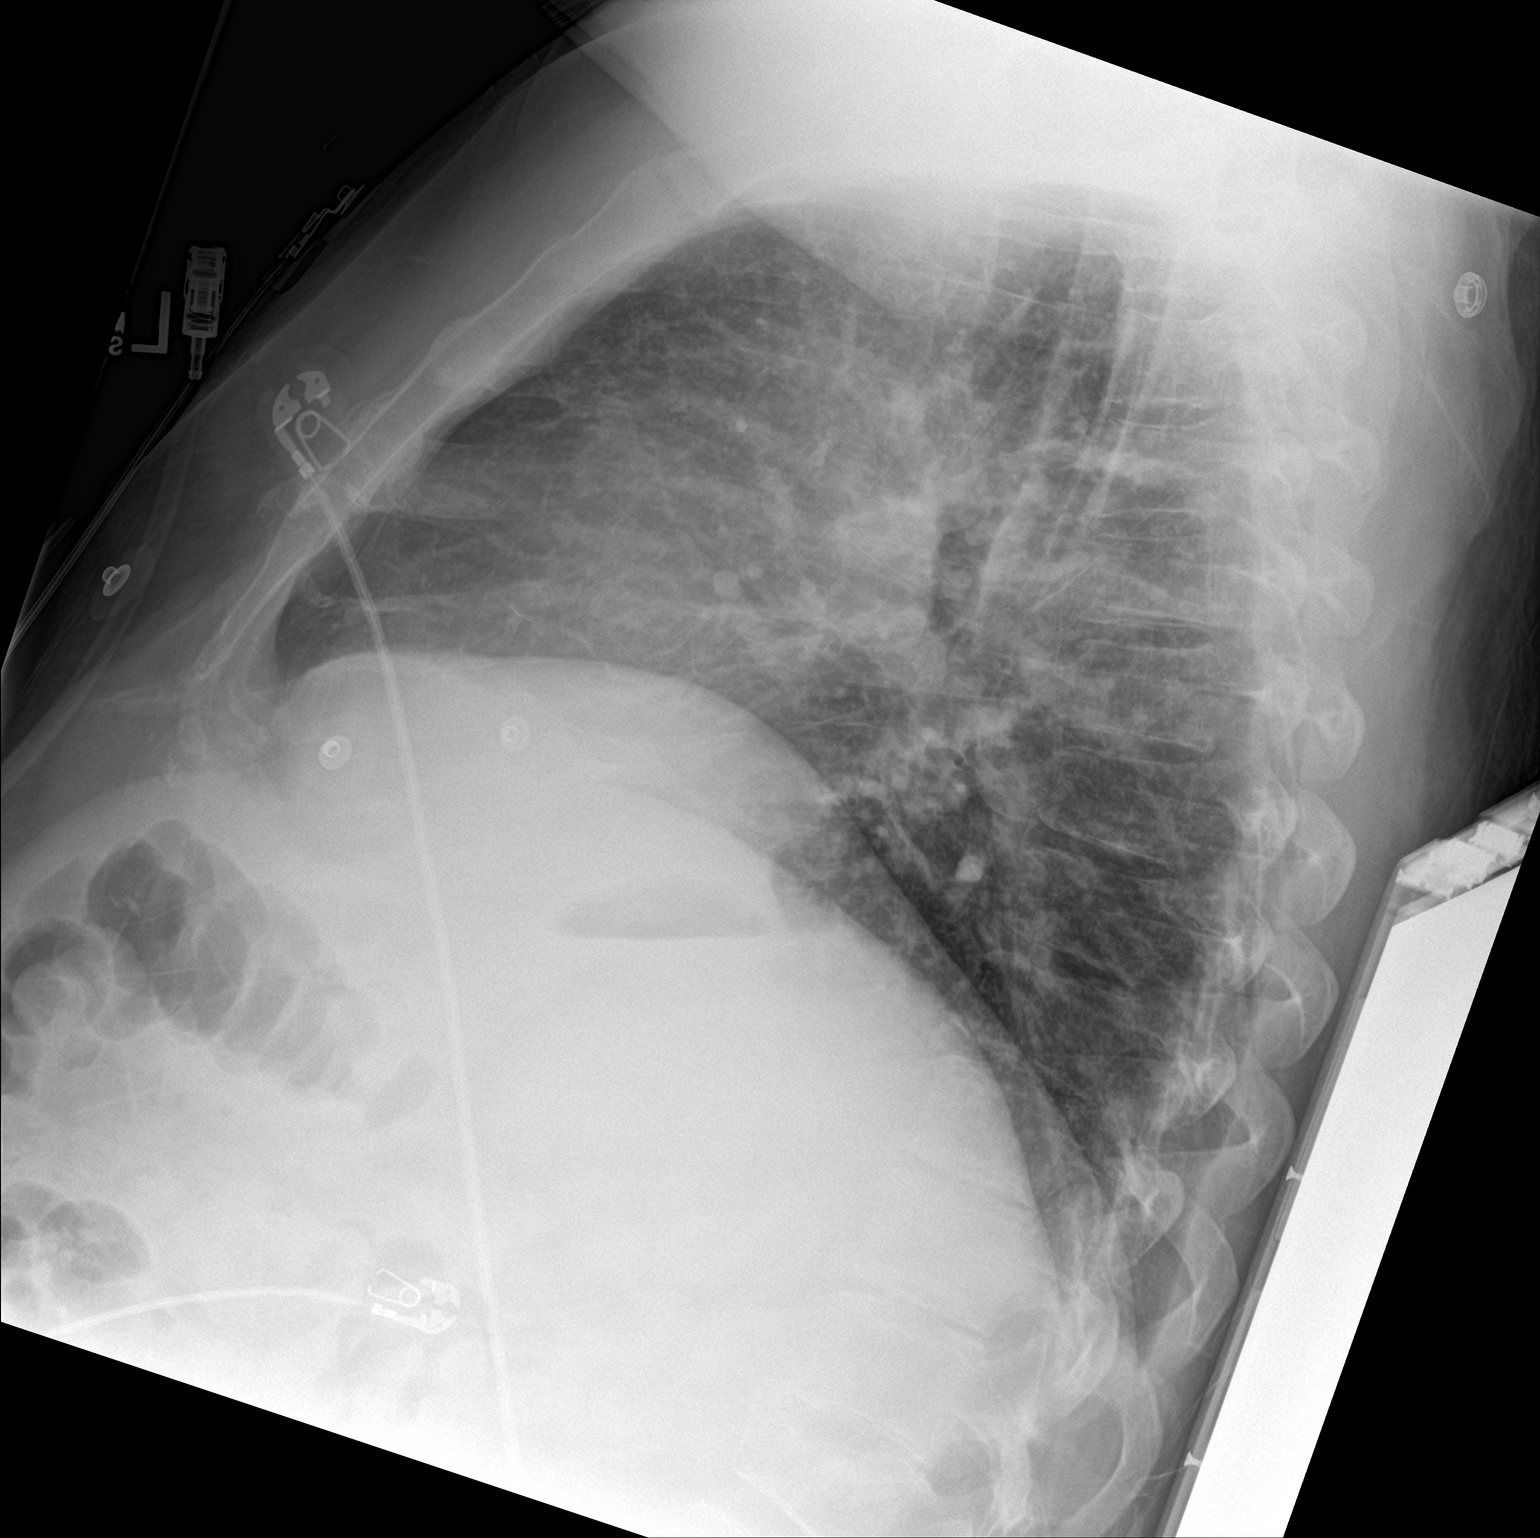

[2 of 2 positions shown; findings below may reference images not displayed]

FINDINGS: The cardiomediastinal silhouette is unchanged in contour.Elevation
the RIGHT hemidiaphragm. No pleural effusion. No pneumothorax. There
are patchy bilateral linear opacities. Peribronchial cuffing.
Visualized abdomen is unremarkable. Revisualization of a compression
fracture deformity of L1.
IMPRESSION: Patchy bilateral linear opacities. Differential considerations
include atelectasis versus infection.

## 2020-09-12 IMAGING — US US RENAL
1 series · 14 of 25 positions shown · non-contrast
Comparison: None available.

CLINICAL DATA: Initial evaluation for acute renal insufficiency.

EXAM:
RENAL / URINARY TRACT ULTRASOUND COMPLETE

[Series 1: us renal · 14 of 46 slices shown]
[im 1/46]
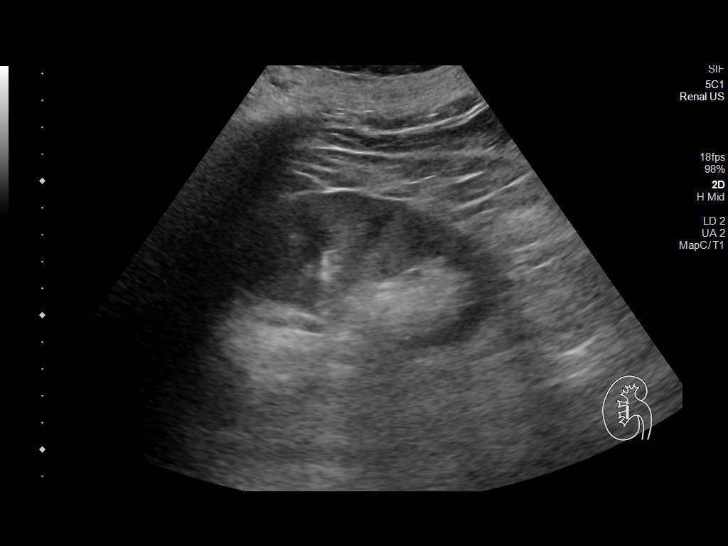
[im 4/46]
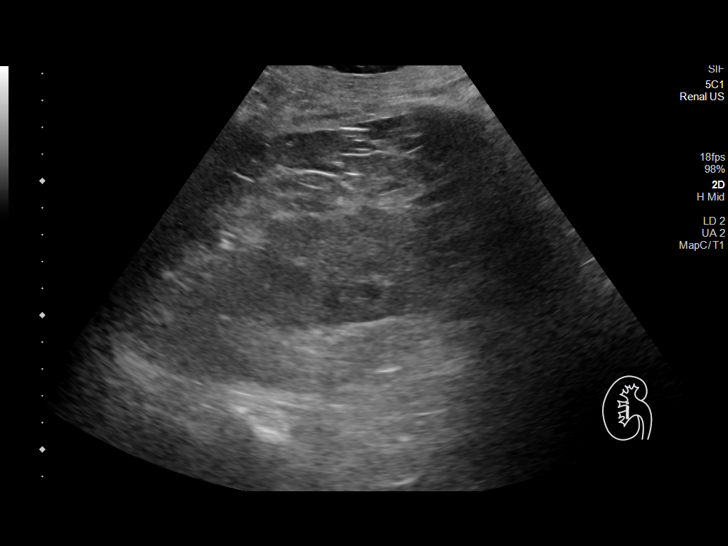
[im 8/46]
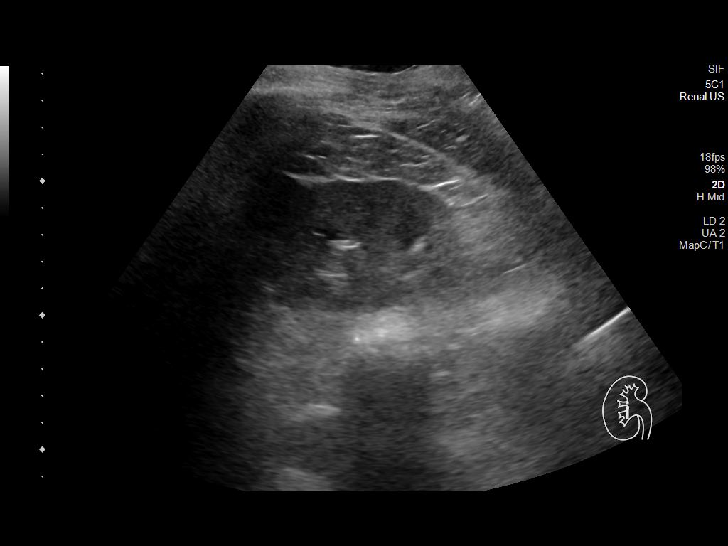
[im 12/46]
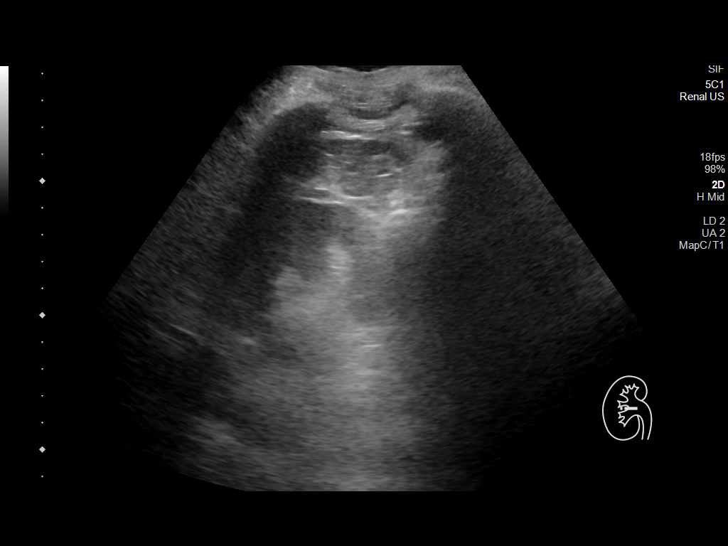
[im 16/46]
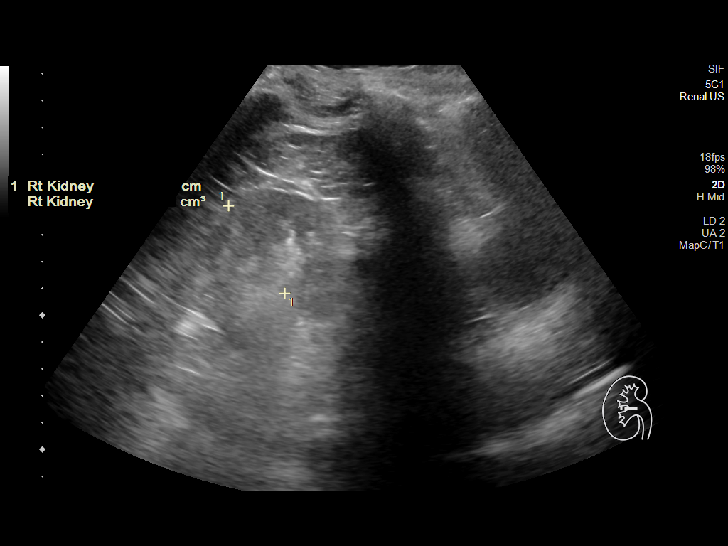
[im 17/46]
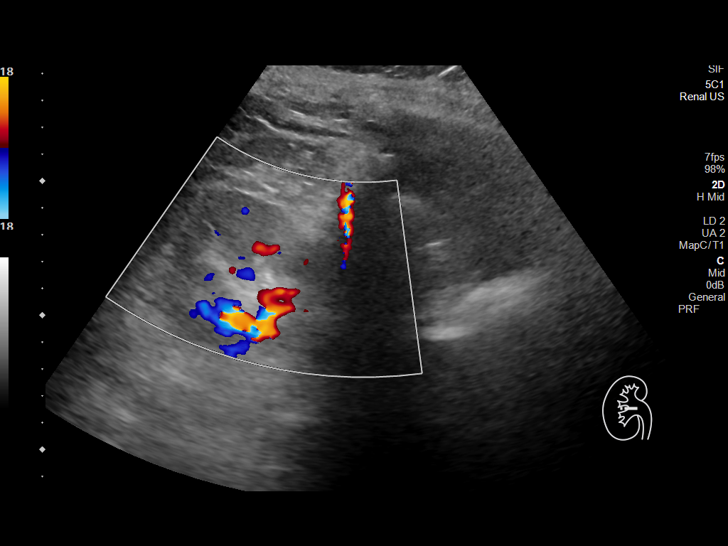
[im 21/46]
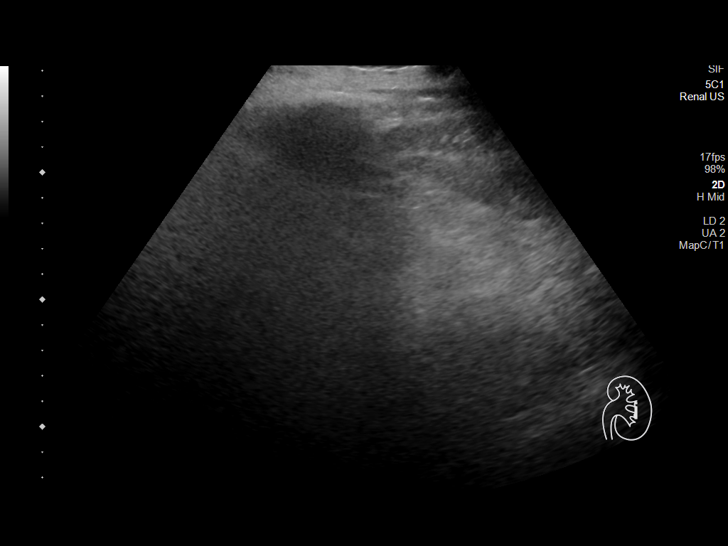
[im 25/46]
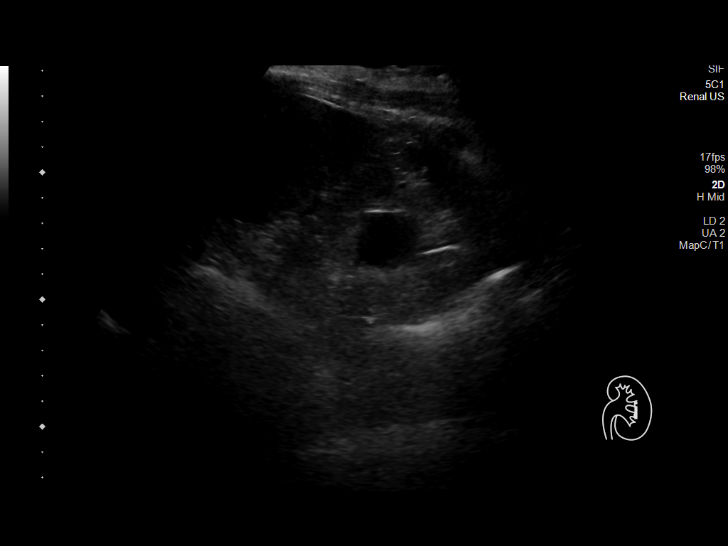
[im 29/46]
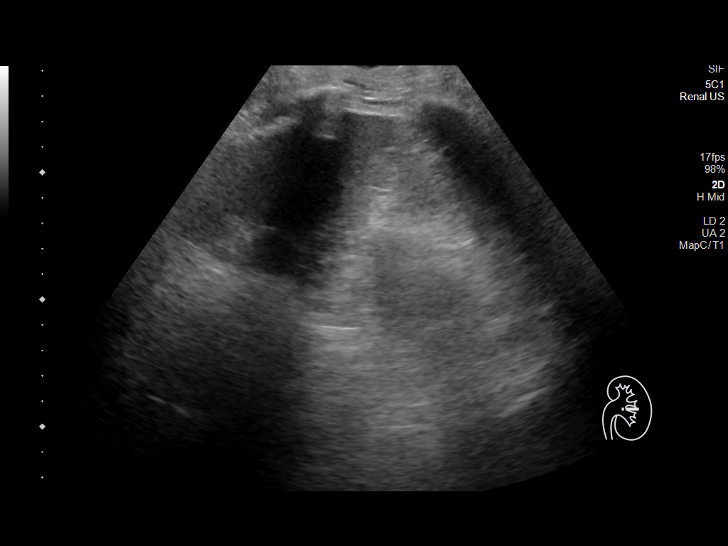
[im 31/46]
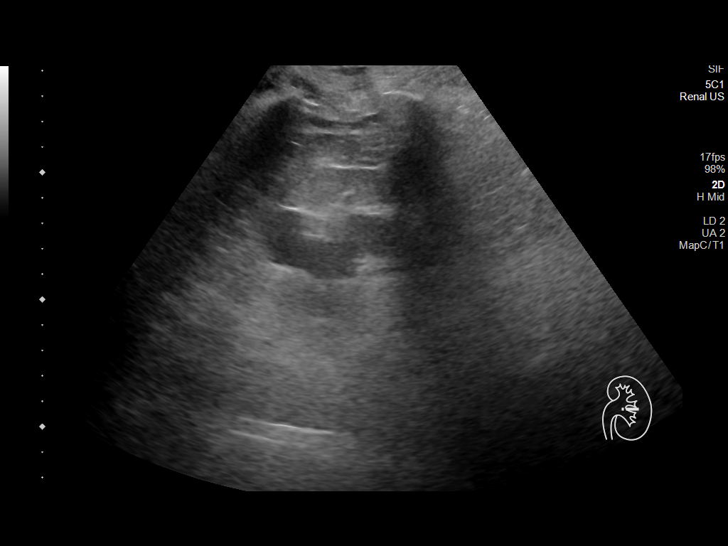
[im 34/46]
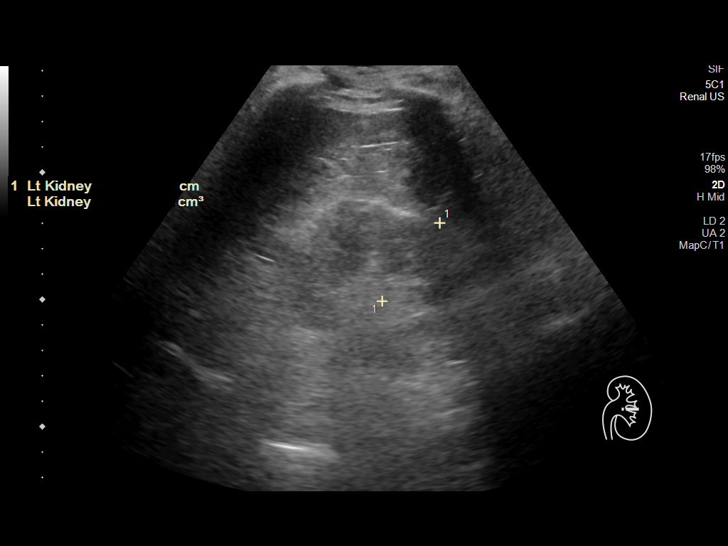
[im 38/46]
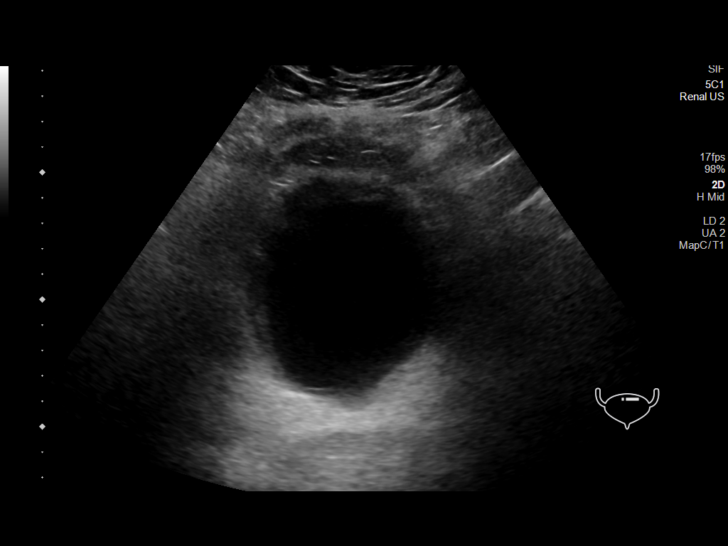
[im 42/46]
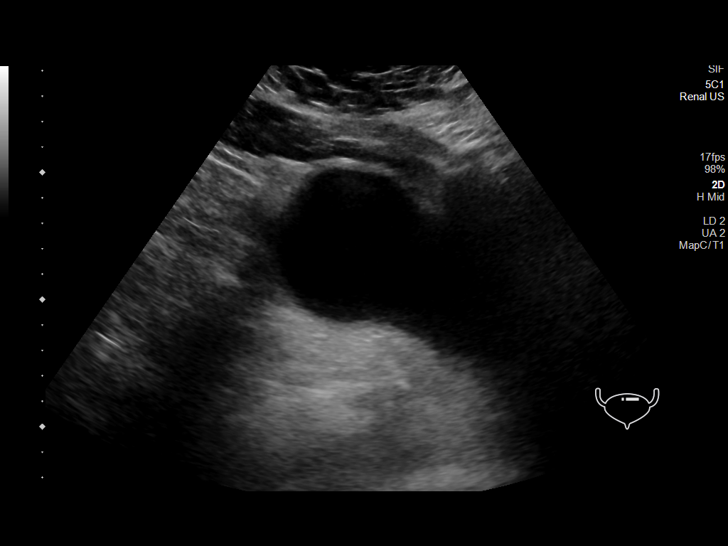
[im 46/46]
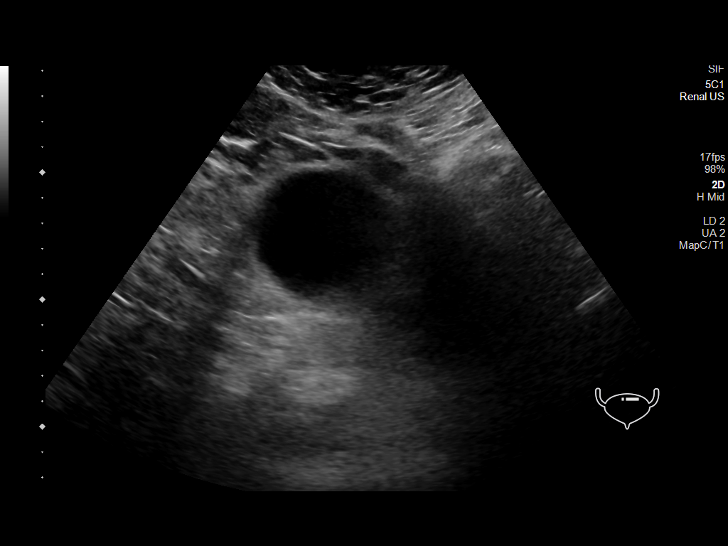

[14 of 25 positions shown; findings below may reference images not displayed]

FINDINGS: Right Kidney:

Renal measurements: 12.2 x 5.6 x 3.9 cm = volume: 138 mL. Increased
echogenicity seen within the renal parenchyma. No nephrolithiasis or
hydronephrosis. No focal renal mass.

Left Kidney:

Renal measurements: 13.0 x 6.7 x 3.8 cm = volume: 172.8 mL.
Increased echogenicity seen within the renal parenchyma. No
nephrolithiasis or hydronephrosis. 3.5 x 2.2 x 2.4 cm benign
appearing cyst noted within the left kidney.

Bladder:

Appears normal for degree of bladder distention.

Other:

None.
IMPRESSION: 1. Increased echogenicity within the renal parenchyma, consistent
with medical renal disease.
2. No hydronephrosis.
3. 3.5 cm benign appearing left renal cyst.

## 2020-11-30 DIAGNOSIS — I10 Essential (primary) hypertension: Secondary | ICD-10-CM | POA: Diagnosis not present

## 2020-11-30 DIAGNOSIS — M1990 Unspecified osteoarthritis, unspecified site: Secondary | ICD-10-CM | POA: Diagnosis not present

## 2020-11-30 DIAGNOSIS — E0590 Thyrotoxicosis, unspecified without thyrotoxic crisis or storm: Secondary | ICD-10-CM | POA: Diagnosis not present

## 2020-11-30 DIAGNOSIS — R69 Illness, unspecified: Secondary | ICD-10-CM | POA: Diagnosis not present

## 2020-11-30 DIAGNOSIS — R739 Hyperglycemia, unspecified: Secondary | ICD-10-CM | POA: Diagnosis not present

## 2020-11-30 DIAGNOSIS — Z6832 Body mass index (BMI) 32.0-32.9, adult: Secondary | ICD-10-CM | POA: Diagnosis not present

## 2020-11-30 DIAGNOSIS — E782 Mixed hyperlipidemia: Secondary | ICD-10-CM | POA: Diagnosis not present

## 2021-03-11 DIAGNOSIS — R739 Hyperglycemia, unspecified: Secondary | ICD-10-CM | POA: Diagnosis not present

## 2021-03-11 DIAGNOSIS — E782 Mixed hyperlipidemia: Secondary | ICD-10-CM | POA: Diagnosis not present

## 2021-03-11 DIAGNOSIS — R69 Illness, unspecified: Secondary | ICD-10-CM | POA: Diagnosis not present

## 2021-03-11 DIAGNOSIS — Z6833 Body mass index (BMI) 33.0-33.9, adult: Secondary | ICD-10-CM | POA: Diagnosis not present

## 2021-03-11 DIAGNOSIS — E0590 Thyrotoxicosis, unspecified without thyrotoxic crisis or storm: Secondary | ICD-10-CM | POA: Diagnosis not present

## 2021-03-11 DIAGNOSIS — M1990 Unspecified osteoarthritis, unspecified site: Secondary | ICD-10-CM | POA: Diagnosis not present

## 2021-03-11 DIAGNOSIS — I10 Essential (primary) hypertension: Secondary | ICD-10-CM | POA: Diagnosis not present

## 2021-08-02 DIAGNOSIS — R630 Anorexia: Secondary | ICD-10-CM | POA: Diagnosis not present

## 2021-08-02 DIAGNOSIS — R69 Illness, unspecified: Secondary | ICD-10-CM | POA: Diagnosis not present

## 2021-08-02 DIAGNOSIS — G4700 Insomnia, unspecified: Secondary | ICD-10-CM | POA: Diagnosis not present

## 2023-02-17 DEATH — deceased
